# Patient Record
Sex: Female | Born: 2005 | Race: Black or African American | Hispanic: No | Marital: Single | Smoking: Current some day smoker
Health system: Southern US, Community
[De-identification: ages and names within clinical notes are randomized; demographics above are authoritative.]

## PROBLEM LIST (undated history)

## (undated) DIAGNOSIS — N739 Female pelvic inflammatory disease, unspecified: Secondary | ICD-10-CM

## (undated) DIAGNOSIS — L309 Dermatitis, unspecified: Secondary | ICD-10-CM

## (undated) HISTORY — DX: Dermatitis, unspecified: L30.9

## (undated) HISTORY — PX: NO PAST SURGERIES: SHX2092

## (undated) HISTORY — PX: WISDOM TOOTH EXTRACTION: SHX21

---
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
## 2024-01-31 ENCOUNTER — Ambulatory Visit (HOSPITAL_COMMUNITY)
Admission: EM | Admit: 2024-01-31 | Discharge: 2024-01-31 | Disposition: A | Discharge status: Home / Self Care | Attending: Neurology | Admitting: Neurology

## 2024-01-31 ENCOUNTER — Encounter (HOSPITAL_COMMUNITY): Payer: Self-pay

## 2024-01-31 DIAGNOSIS — Z113 Encounter for screening for infections with a predominantly sexual mode of transmission: Secondary | ICD-10-CM | POA: Diagnosis not present

## 2024-01-31 NOTE — ED Provider Notes (Addendum)
 [facility]    CSN: [id number] Arrival date & time: 01/31/24  1324      History   Chief Complaint Chief Complaint  Patient presents with   SEXUALLY TRANSMITTED DISEASE    HPI Amy Higgins is a 18 y.o. female.   Patient presents for STD testing she does not have a known exposure.  She is having thick white discharge.  Additionally she has noticed that her skin on her face has been more dry than usual.  She has been using over-the-counter glycolic acid on her skin.  This is the main change she has made to her skin care routine.  The history is provided by the patient.    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 Authorizing Provider  albuterol  (PROAIR  HFA) 108 (90 Base) MCG/ACT inhaler INHALE 2 PUFFS EVERY 4 TO 6 HOURS AS NEEDED FOR COUGH/WHEEZE, USE WITH SPACER 07/02/16   Rochester Chuck, MD  cetirizine  (ZYRTEC ) 1 MG/ML syrup Take 5 mLs (5 mg total) by mouth daily. Patient not taking: Reported on 06/28/2023 07/02/16   Rochester Chuck, MD  EPINEPHrine  (EPIPEN  JR 2-PAK) 0.15 MG/0.3ML injection Inject 0.3 mLs (0.15 mg total) into the muscle as needed for anaphylaxis. 07/02/16   Rochester Chuck, MD  fluticasone  (FLONASE ) 50 MCG/ACT nasal spray One spray into each nostril daily. Patient not  taking: Reported on 06/28/2023 07/02/16   Rochester Chuck, MD  HYDROcodone -acetaminophen  (NORCO/VICODIN) 5-325 MG tablet Take 1 tablet by mouth every 4 (four) hours as needed for severe pain. Patient not taking: Reported on 06/28/2023 01/06/23   Vedia Geralds, NP  ibuprofen  (ADVIL ) 600 MG tablet Take 1 tablet (600 mg total) by mouth every 6 (six) hours as needed. 01/06/23   Vedia Geralds, NP  montelukast  (SINGULAIR ) 5 MG chewable tablet Take one tablet at bedtime. Patient not taking: Reported on 06/28/2023 07/02/16   Rochester Chuck, MD  ondansetron  (ZOFRAN ) 4 MG tablet Take 1 tablet (4 mg total) by mouth every 8 (eight) hours as needed for nausea or vomiting. Patient not taking: Reported on 06/28/2023 05/25/22   Dalkin, William A, MD  ondansetron  (ZOFRAN -ODT) 4 MG disintegrating tablet Take 1 tablet (4 mg total) by mouth every 8 (eight) hours as needed. Patient not taking: Reported on 06/28/2023 01/04/23   Dalkin, William A, MD  ranitidine  (ZANTAC ) 15 MG/ML syrup Give 3 ml in the mornings Patient not taking: Reported on 06/28/2023 07/02/16   Rochester Chuck, MD    Family History Family History  Problem Relation Age of Onset   Asthma Father    Eczema Father    Allergic rhinitis Father    Allergic rhinitis Sister    Allergic rhinitis Brother    Allergic rhinitis Paternal Grandmother    Asthma Paternal Grandmother    Eczema Paternal Grandmother    Angioedema Neg Hx    Atopy Neg Hx    Immunodeficiency Neg Hx    Urticaria Neg Hx     Social History Social History   Tobacco Use   Smoking status: Some Days    Types: Cigarettes    Passive exposure: Yes   Smokeless tobacco: Never  Vaping Use   Vaping status: Never Used  Substance Use Topics   Alcohol use: Yes    Comment: Social   Drug use: Not Currently     Allergies   Fish allergy   Review of Systems Review of Systems   Physical Exam Triage Vital Signs ED Triage Vitals  Encounter Vitals Group     BP  01/31/24 1434 (!) 132/90     Systolic BP Percentile --      Diastolic BP Percentile --      Pulse Rate 01/31/24 1434 88     Resp 01/31/24 1434 18     Temp 01/31/24 1434 98.3 F (36.8 C)     Temp Source 01/31/24 1434 Oral     SpO2 01/31/24 1434 98 %     Weight --      Height --      Head Circumference --      Peak Flow --      Pain Score 01/31/24 1435 0     Pain Loc --      Pain Education --      Exclude from Growth Chart --    No data found.  Updated Vital Signs BP (!) 132/90 (BP Location: Left Arm)   Pulse 88   Temp 98.3 F (36.8 C) (Oral)   Resp 18   LMP 01/05/2024 (Approximate)   SpO2 98%   Visual Acuity Right Eye Distance:   Left Eye Distance:   Bilateral Distance:    Right Eye Near:   Left Eye Near:    Bilateral Near:     Physical Exam Vitals and nursing note reviewed.  Constitutional:      General: She is not in acute distress.    Appearance: She is well-developed.  HENT:     Head: Normocephalic and atraumatic.     Comments: She does have some dry skin around her mouth.  No rash or irritation noted though. Eyes:     Conjunctiva/sclera: Conjunctivae normal.  Cardiovascular:     Rate and Rhythm: Normal rate and regular rhythm.     Heart sounds: No murmur heard. Pulmonary:     Effort: Pulmonary effort is normal. No respiratory distress.     Breath sounds: Normal breath sounds.  Abdominal:     Palpations: Abdomen is soft.     Tenderness: There is no abdominal tenderness.  Musculoskeletal:        General: No swelling.     Cervical back: Neck supple.  Skin:    General: Skin is warm and dry.     Capillary Refill: Capillary refill takes less than 2 seconds.  Neurological:     Mental Status: She is alert.  Psychiatric:        Mood and Affect: Mood normal.      UC Treatments / Results  Labs (all labs ordered  are listed, but only abnormal results are displayed) Labs Reviewed  CERVICOVAGINAL ANCILLARY ONLY    EKG   Radiology No results  found.  Procedures Procedures (including critical care time)  Medications Ordered in UC Medications - No data to display  Initial Impression / Assessment and Plan / UC Course  I have reviewed the triage vital signs and the nursing notes.  Pertinent labs & imaging results that were available during my care of the patient were reviewed by me and considered in my medical decision making (see chart for details).  Discussed reducing the amount of glycolic acid she is using on her face and underarms as this might be causing irritation.  She is going to try Aquaphor.  Additionally we will follow-up with her with her STD results and any necessary treatment.  Patient is in agreement with plan of care.  Final Clinical Impressions(s) / UC Diagnoses   Final diagnoses:  Screen for STD (sexually transmitted disease)     Discharge Instructions      Be careful using glycolic acid with additional topical products as it can make your skin more sensitive and cause more irritation.  We will follow-up with you for your STD swab via phone with any abnormal results.  You will also see these results on your MyChart.        ED Prescriptions   None    PDMP not reviewed this encounter.   Imogene Mana, NP 01/31/24 1736    Imogene Mana, NP 01/31/24 1736

## 2024-01-31 NOTE — ED Triage Notes (Signed)
 Pt requesting STD testing. States some itching but nothing new. Denies known exposure.   Pt c/o bumps on lips after doing her skin routine 1.5wks ago.

## 2024-01-31 NOTE — Discharge Instructions (Addendum)
 Be careful using glycolic acid with additional topical products as it can make your skin more sensitive and cause more irritation.  We will follow-up with you for your STD swab via phone with any abnormal results.  You will also see these results on your MyChart.

## 2024-02-01 ENCOUNTER — Telehealth (HOSPITAL_COMMUNITY): Payer: Self-pay

## 2024-02-01 LAB — CERVICOVAGINAL ANCILLARY ONLY
Bacterial Vaginitis (gardnerella): NEGATIVE
Candida Glabrata: NEGATIVE
Candida Vaginitis: POSITIVE — AB
Chlamydia: NEGATIVE
Comment: NEGATIVE
Comment: NEGATIVE
Comment: NEGATIVE
Comment: NEGATIVE
Comment: NEGATIVE
Comment: NORMAL
Neisseria Gonorrhea: NEGATIVE
Trichomonas: NEGATIVE

## 2024-02-01 MED ORDER — FLUCONAZOLE 150 MG PO TABS: 150.0000 mg | ORAL_TABLET | Freq: Once | ORAL | 0 refills | Status: AC

## 2024-02-01 NOTE — Telephone Encounter (Signed)
 Per protocol, pt requires tx with Diflucan.  Rx sent to pharmacy on file.

## 2024-04-20 ENCOUNTER — Encounter

## 2024-04-24 ENCOUNTER — Encounter (HOSPITAL_COMMUNITY): Payer: Self-pay

## 2024-04-24 ENCOUNTER — Ambulatory Visit (HOSPITAL_COMMUNITY)
Admission: EM | Admit: 2024-04-24 | Discharge: 2024-04-24 | Disposition: A | Discharge status: Home / Self Care | Attending: Family Medicine | Admitting: Family Medicine

## 2024-04-24 DIAGNOSIS — B3731 Acute candidiasis of vulva and vagina: Secondary | ICD-10-CM | POA: Insufficient documentation

## 2024-04-24 DIAGNOSIS — N760 Acute vaginitis: Secondary | ICD-10-CM | POA: Insufficient documentation

## 2024-04-24 DIAGNOSIS — B370 Candidal stomatitis: Secondary | ICD-10-CM | POA: Diagnosis present

## 2024-04-24 DIAGNOSIS — Z7721 Contact with and (suspected) exposure to potentially hazardous body fluids: Secondary | ICD-10-CM | POA: Diagnosis not present

## 2024-04-24 MED ORDER — FLUCONAZOLE 100 MG PO TABS: 100.0000 mg | ORAL_TABLET | Freq: Every day | ORAL | 0 refills | Status: DC

## 2024-04-24 NOTE — ED Triage Notes (Signed)
 Patient here today to be tested for Stds. Swab only

## 2024-04-24 NOTE — ED Provider Notes (Signed)
 [facility]    CSN: [id number] Arrival date & time: 04/24/24  1324      History   Chief Complaint Chief Complaint  Patient presents with   SEXUALLY TRANSMITTED DISEASE    HPI Amy Higgins is a 18 y.o. female.   18 year old female who has some mild vaginal itch and would like STI testing.  She denies any specific vaginal discharge.  She does have unprotected sexual relations.  She denies vaginal odor, dysuria, fever, lower abdominal pain.  She wants to do an STI swab but she does not want blood work.     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 Authorizing Provider  fluconazole  (DIFLUCAN ) 100 MG tablet Take 1 tablet (100 mg total) by mouth daily. 04/24/24  Yes Ival Domino, FNP  EPINEPHrine  (EPIPEN  JR 2-PAK) 0.15 MG/0.3ML injection Inject 0.3 mLs (0.15 mg total) into the muscle as needed for anaphylaxis. 07/02/16   Iva Marty Saltness, MD  fluticasone  (FLONASE ) 50 MCG/ACT nasal spray One spray into each nostril daily. Patient not taking: Reported on 06/28/2023 07/02/16   Iva Marty Saltness, MD  ibuprofen  (ADVIL ) 600 MG tablet Take 1 tablet (600 mg total) by mouth every 6 (six) hours as needed. 01/06/23   Lang Maxwell, NP  montelukast  (SINGULAIR ) 5 MG chewable tablet Take one tablet at  bedtime. Patient not taking: Reported on 06/28/2023 07/02/16   Iva Marty Saltness, MD    Family History Family History  Problem Relation Age of Onset   Asthma Father    Eczema Father    Allergic rhinitis Father    Allergic rhinitis Sister    Allergic rhinitis Brother    Allergic rhinitis Paternal Grandmother    Asthma Paternal Grandmother    Eczema Paternal Grandmother    Angioedema Neg Hx    Atopy Neg Hx    Immunodeficiency Neg Hx    Urticaria Neg Hx     Social History Social History   Tobacco Use   Smoking status: Some Days    Types: Cigarettes    Passive exposure: Yes   Smokeless tobacco: Never  Vaping Use   Vaping status: Never Used  Substance Use Topics   Alcohol use: Yes    Comment: Social   Drug use: Not Currently     Allergies   Fish allergy   Review of Systems Review of Systems  Constitutional:  Negative for chills and fever.  HENT:  Positive for mouth sores (She has a white coated tongue that sore). Negative for ear  pain and sore throat.   Eyes:  Negative for pain and visual disturbance.  Respiratory:  Negative for cough and shortness of breath.   Cardiovascular:  Negative for chest pain and palpitations.  Gastrointestinal:  Negative for abdominal pain, constipation, diarrhea, nausea and vomiting.  Genitourinary:  Negative for dysuria, hematuria and vaginal discharge (No vaginal discharge but she has a mild vaginal itch).  Musculoskeletal:  Negative for arthralgias and back pain.  Skin:  Negative for color change and rash.  Neurological:  Negative for seizures and syncope.  All other systems reviewed and are negative.    Physical Exam Triage Vital Signs ED Triage Vitals [04/24/24 1351]  Encounter Vitals Group     BP 117/78     Girls Systolic BP Percentile      Girls Diastolic BP Percentile      Boys Systolic BP Percentile      Boys Diastolic BP Percentile      Pulse Rate 82     Resp 16     Temp 98.4 F (36.9 C)     Temp Source Oral      SpO2 96 %     Weight      Height      Head Circumference      Peak Flow      Pain Score 0     Pain Loc      Pain Education      Exclude from Growth Chart    No data found.  Updated Vital Signs BP 117/78 (BP Location: Right Arm)   Pulse 82   Temp 98.4 F (36.9 C) (Oral)   Resp 16   LMP 03/29/2024 (Exact Date)   SpO2 96%   Visual Acuity Right Eye Distance:   Left Eye Distance:   Bilateral Distance:    Right Eye Near:   Left Eye Near:    Bilateral Near:     Physical Exam Vitals and nursing note reviewed.  Constitutional:      General: She is not in acute distress.    Appearance: She is well-developed. She is not ill-appearing or toxic-appearing.  HENT:     Head: Normocephalic and atraumatic.     Right Ear: Hearing, tympanic membrane, ear canal and external ear normal.     Left Ear: Hearing, tympanic membrane, ear canal and external ear normal.     Nose: No congestion or rhinorrhea.     Right Sinus: No maxillary sinus tenderness or frontal sinus tenderness.     Left Sinus: No maxillary sinus tenderness or frontal sinus tenderness.     Mouth/Throat:     Lips: Pink.     Mouth: Mucous membranes are moist.     Tongue: Lesions (White coated tongue) present.     Pharynx: Uvula midline. No oropharyngeal exudate or posterior oropharyngeal erythema.     Tonsils: No tonsillar exudate.  Eyes:     Conjunctiva/sclera: Conjunctivae normal.     Pupils: Pupils are equal, round, and reactive to light.  Cardiovascular:     Rate and Rhythm: Normal rate and regular rhythm.     Heart sounds: S1 normal and S2 normal. No murmur heard. Pulmonary:     Effort: Pulmonary effort is normal. No respiratory distress.     Breath sounds: Normal breath sounds. No decreased breath sounds, wheezing, rhonchi or rales.  Abdominal:     General: Bowel sounds are normal.     Palpations: Abdomen is soft.     Tenderness: There is no abdominal  tenderness.  Genitourinary:    Comments: GU exam declined.   Patient wants to do a self swab only Musculoskeletal:        General: No swelling.     Cervical back: Neck supple.  Lymphadenopathy:     Head:     Right side of head: No submental, submandibular, tonsillar, preauricular or posterior auricular adenopathy.     Left side of head: No submental, submandibular, tonsillar, preauricular or posterior auricular adenopathy.     Cervical: No cervical adenopathy.     Right cervical: No superficial cervical adenopathy.    Left cervical: No superficial cervical adenopathy.  Skin:    General: Skin is warm and dry.     Capillary Refill: Capillary refill takes less than 2 seconds.     Findings: No rash.  Neurological:     Mental Status: She is alert and oriented to person, place, and time.  Psychiatric:        Mood and Affect: Mood normal.      UC Treatments / Results  Labs (all labs ordered are listed, but only abnormal results are displayed) Labs Reviewed  CERVICOVAGINAL ANCILLARY ONLY    EKG   Radiology No results found.  Procedures Procedures (including critical care time)  Medications Ordered in UC Medications - No data to display  Initial Impression / Assessment and Plan / UC Course  I have reviewed the triage vital signs and the nursing notes.  Pertinent labs & imaging results that were available during my care of the patient were reviewed by me and considered in my medical decision making (see chart for details).  Plan of Care: Vaginitis: Patient self collected STI swab.  Patient declined blood work.  Will adjust the plan of care, if needed once the swab results.  Oral thrush and vaginal candidiasis: Fluconazole  100 mg daily for 7 days.  Follow-up as needed.  I reviewed the plan of care with the patient and/or the patient's guardian.  The patient and/or guardian had time to ask questions and acknowledged that the questions were answered.  I provided instruction on symptoms or reasons to return here or to go to an ER, if  symptoms/condition did not improve, worsened or if new symptoms occurred.  Final Clinical Impressions(s) / UC Diagnoses   Final diagnoses:  Exposure to potentially hazardous body fluids  Vaginal candidiasis  Oral thrush  Vaginitis and vulvovaginitis     Discharge Instructions      Oral thrush and vaginal candidiasis: Based on exam we will treat for oral thrush.  Fluconazole  100 mg daily for 7 days.    Vaginitis and exposure to body fluids: STI swab collected.  Declined STI blood test.  Will adjust the plan of care, if needed once the STI swab results.  Follow-up if symptoms do not improve, worsen or new symptoms occur.     ED Prescriptions     Medication Sig Dispense Auth. Provider   fluconazole  (DIFLUCAN ) 100 MG tablet Take 1 tablet (100 mg total) by mouth daily. 7 tablet Reginna Sermeno, FNP      PDMP not reviewed this encounter.   Ival Domino, FNP 04/24/24 [phone]

## 2024-04-24 NOTE — Discharge Instructions (Signed)
 Oral thrush and vaginal candidiasis: Based on exam we will treat for oral thrush.  Fluconazole  100 mg daily for 7 days.    Vaginitis and exposure to body fluids: STI swab collected.  Declined STI blood test.  Will adjust the plan of care, if needed once the STI swab results.  Follow-up if symptoms do not improve, worsen or new symptoms occur.

## 2024-04-25 LAB — CERVICOVAGINAL ANCILLARY ONLY
Bacterial Vaginitis (gardnerella): POSITIVE — AB
Candida Glabrata: NEGATIVE
Candida Vaginitis: NEGATIVE
Chlamydia: NEGATIVE
Comment: NEGATIVE
Comment: NEGATIVE
Comment: NEGATIVE
Comment: NEGATIVE
Comment: NEGATIVE
Comment: NORMAL
Neisseria Gonorrhea: NEGATIVE
Trichomonas: POSITIVE — AB

## 2024-04-27 ENCOUNTER — Ambulatory Visit (HOSPITAL_COMMUNITY): Payer: Self-pay

## 2024-04-27 MED ORDER — METRONIDAZOLE 500 MG PO TABS: 500.0000 mg | ORAL_TABLET | Freq: Two times a day (BID) | ORAL | 0 refills | Status: AC

## 2024-05-21 ENCOUNTER — Ambulatory Visit (HOSPITAL_COMMUNITY): Admission: EM | Admit: 2024-05-21 | Discharge: 2024-05-21 | Discharge status: Against Medical Advice

## 2024-05-21 NOTE — ED Notes (Signed)
No answer from lobby  

## 2024-05-21 NOTE — ED Notes (Signed)
 No answer x2

## 2024-06-03 ENCOUNTER — Inpatient Hospital Stay
Admission: RE | Admit: 2024-06-03 | Discharge: 2024-06-03 | Discharge status: Home / Self Care | Source: Ambulatory Visit | Attending: Family Medicine

## 2024-06-03 ENCOUNTER — Other Ambulatory Visit: Payer: Self-pay

## 2024-06-03 VITALS — BP 119/77 | HR 66 | Temp 98.3°F | Resp 16

## 2024-06-03 DIAGNOSIS — Z113 Encounter for screening for infections with a predominantly sexual mode of transmission: Secondary | ICD-10-CM | POA: Diagnosis present

## 2024-06-03 DIAGNOSIS — N949 Unspecified condition associated with female genital organs and menstrual cycle: Secondary | ICD-10-CM | POA: Insufficient documentation

## 2024-06-03 MED ORDER — ACYCLOVIR 400 MG PO TABS: 400.0000 mg | ORAL_TABLET | Freq: Three times a day (TID) | ORAL | 0 refills | Status: AC

## 2024-06-03 MED ORDER — DOXYCYCLINE HYCLATE 100 MG PO CAPS: 100.0000 mg | ORAL_CAPSULE | Freq: Two times a day (BID) | ORAL | 0 refills | Status: DC

## 2024-06-03 NOTE — ED Provider Notes (Signed)
 [facility]    CSN: [id number] Arrival date & time: 06/03/24  1250      History   Chief Complaint Chief Complaint  Patient presents with   bumps in vaginal area    HPI Amy Higgins is a 18 y.o. female presents for genital sores.  Patient reports a week and a half of painful spreading vaginal bumps on the left and right side of her vagina.  She states she has pain with sitting, palpation or movement.  Only has pain when she urinates if the urine touches lesions.  She denies any vaginal discharge, fevers, hematuria.  No known STD exposure or concern.  Denies history of herpes or vaginal rashes in the past.  She does shave/trim the area frequently.  In addition she was treated in mid July for trichomonas and BV.  She states she was asymptomatic at the time.  She would like to be retested for test of cure.  No other concerns at this time.  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 Authorizing Provider  acyclovir  (ZOVIRAX ) 400 MG tablet Take 1 tablet (400 mg total) by mouth 3 (three) times daily for 7 days. 06/03/24 06/10/24 Yes Franz Svec, Jodi R, NP  doxycycline  (VIBRAMYCIN ) 100 MG capsule Take 1 capsule (100 mg total) by mouth 2 (two) times daily. 06/03/24  Yes Shelagh Rayman, Jodi R, NP  EPINEPHrine  (EPIPEN  JR 2-PAK) 0.15  MG/0.3ML injection Inject 0.3 mLs (0.15 mg total) into the muscle as needed for anaphylaxis. 07/02/16   Iva Marty Saltness, MD  fluconazole  (DIFLUCAN ) 100 MG tablet Take 1 tablet (100 mg total) by mouth daily. 04/24/24   Ival Domino, FNP  fluticasone  (FLONASE ) 50 MCG/ACT nasal spray One spray into each nostril daily. Patient not taking: Reported on 06/28/2023 07/02/16   Iva Marty Saltness, MD  ibuprofen  (ADVIL ) 600 MG tablet Take 1 tablet (600 mg total) by mouth every 6 (six) hours as needed. 01/06/23   Lang Maxwell, NP  montelukast  (SINGULAIR ) 5 MG chewable tablet Take one tablet at bedtime. Patient not taking: Reported on 06/28/2023 07/02/16   Iva Marty Saltness, MD    Family History Family History  Problem Relation Age of Onset   Asthma Father    Eczema Father    Allergic rhinitis Father    Allergic rhinitis Sister    Allergic rhinitis Brother    Allergic rhinitis Paternal Grandmother    Asthma Paternal Grandmother    Eczema Paternal Grandmother    Angioedema Neg Hx  Atopy Neg Hx    Immunodeficiency Neg Hx    Urticaria Neg Hx     Social History Social History   Tobacco Use   Smoking status: Some Days    Types: Cigarettes    Passive exposure: Yes   Smokeless tobacco: Never  Vaping Use   Vaping status: Never Used  Substance Use Topics   Alcohol use: Yes    Comment: Social   Drug use: Not Currently     Allergies   Fish allergy   Review of Systems Review of Systems  Genitourinary:  Positive for vaginal pain.     Physical Exam Triage Vital Signs ED Triage Vitals  Encounter Vitals Group     BP 06/03/24 1311 119/77     Girls Systolic BP Percentile --      Girls Diastolic BP Percentile --      Boys Systolic BP Percentile --      Boys Diastolic BP Percentile --      Pulse Rate 06/03/24 1311 66     Resp 06/03/24 1311 16     Temp 06/03/24 1311 98.3 F (36.8 C)     Temp Source 06/03/24 1311 Oral     SpO2 06/03/24 1311 98 %     Weight --       Height --      Head Circumference --      Peak Flow --      Pain Score 06/03/24 1309 8     Pain Loc --      Pain Education --      Exclude from Growth Chart --    No data found.  Updated Vital Signs BP 119/77   Pulse 66   Temp 98.3 F (36.8 C) (Oral)   Resp 16   LMP 05/24/2024   SpO2 98%   Visual Acuity Right Eye Distance:   Left Eye Distance:   Bilateral Distance:    Right Eye Near:   Left Eye Near:    Bilateral Near:     Physical Exam Vitals and nursing note reviewed. Exam conducted with a chaperone present Art gallery manager).  Constitutional:      General: She is not in acute distress.    Appearance: Normal appearance. She is not ill-appearing.  HENT:     Head: Normocephalic and atraumatic.  Eyes:     Pupils: Pupils are equal, round, and reactive to light.  Cardiovascular:     Rate and Rhythm: Normal rate.  Pulmonary:     Effort: Pulmonary effort is normal.  Genitourinary:     Comments: There are scattered painful papules bilaterally along the labia that extends to the buttock.  There is no drainage swelling induration or fluctuance. Skin:    General: Skin is warm and dry.  Neurological:     General: No focal deficit present.     Mental Status: She is alert and oriented to person, place, and time.  Psychiatric:        Mood and Affect: Mood normal.        Behavior: Behavior normal.      UC Treatments / Results  Labs (all labs ordered are listed, but only abnormal results are displayed) Labs Reviewed  HSV 1/2 PCR (SURFACE)  CERVICOVAGINAL ANCILLARY ONLY    EKG   Radiology No results found.  Procedures Procedures (including critical care time)  Medications Ordered in UC Medications - No data to display  Initial Impression / Assessment and Plan / UC Course  I  have reviewed the triage vital signs and the nursing notes.  Pertinent labs & imaging results that were available during my care of the patient were reviewed by me and considered in my  medical decision making (see chart for details).     I reviewed exam and symptoms with patient.  Swab for HSV obtained and will contact for any positive results. Does not appear classic for this given her symptoms advised to start antiviral medication will await results.  Will also start doxycycline  to cover for any type of folliculitis or skin infection as well.  She was instructed not to shave or trim the area until symptoms have completely resolved.  STD testing as requested and will contact for any positive results.  Advised PCP or gynecology follow-up as symptoms do not improve.  ER precautions reviewed. Final Clinical Impressions(s) / UC Diagnoses   Final diagnoses:  Screening examination for STD (sexually transmitted disease)  Genital lesion, female     Discharge Instructions      Clinical contact you with results of the testing done today if positive.  Start acyclovir  antiviral medication 3 times a day for a week.  Also start doxycycline  twice daily for 10 days.  Avoid shaving or trimming the area until your symptoms have completely resolved.  Please follow-up with your gynecologist or PCP if your symptoms do not improve.  Please go to the ER for any worsening symptoms.  Hope you feel better soon!    ED Prescriptions     Medication Sig Dispense Auth. Provider   acyclovir  (ZOVIRAX ) 400 MG tablet Take 1 tablet (400 mg total) by mouth 3 (three) times daily for 7 days. 21 tablet Addam Goeller, Jodi R, NP   doxycycline  (VIBRAMYCIN ) 100 MG capsule Take 1 capsule (100 mg total) by mouth 2 (two) times daily. 20 capsule Tona Qualley, Jodi R, NP      PDMP not reviewed this encounter.   Loreda Myla SAUNDERS, NP 06/03/24 1327

## 2024-06-03 NOTE — ED Triage Notes (Signed)
 Pt c/o bumps in vagina and states it's painful when she sits on them and moves aroundx1.5wks ago

## 2024-06-03 NOTE — Discharge Instructions (Addendum)
 Clinical contact you with results of the testing done today if positive.  Start acyclovir  antiviral medication 3 times a day for a week.  Also start doxycycline  twice daily for 10 days.  Avoid shaving or trimming the area until your symptoms have completely resolved.  Please follow-up with your gynecologist or PCP if your symptoms do not improve.  Please go to the ER for any worsening symptoms.  Hope you feel better soon!

## 2024-06-04 LAB — CERVICOVAGINAL ANCILLARY ONLY
Chlamydia: POSITIVE — AB
Comment: NEGATIVE
Comment: NEGATIVE
Comment: NORMAL
Neisseria Gonorrhea: NEGATIVE
Trichomonas: NEGATIVE

## 2024-06-04 LAB — HSV 1/2 PCR (SURFACE)
HSV-1 DNA: NOT DETECTED
HSV-2 DNA: DETECTED — AB

## 2024-06-05 ENCOUNTER — Ambulatory Visit (HOSPITAL_COMMUNITY): Payer: Self-pay

## 2024-09-07 ENCOUNTER — Ambulatory Visit (HOSPITAL_COMMUNITY)
Admission: EM | Admit: 2024-09-07 | Discharge: 2024-09-07 | Disposition: A | Discharge status: Home / Self Care | Attending: Emergency Medicine | Admitting: Emergency Medicine

## 2024-09-07 ENCOUNTER — Encounter (HOSPITAL_COMMUNITY): Payer: Self-pay | Admitting: Emergency Medicine

## 2024-09-07 DIAGNOSIS — Z113 Encounter for screening for infections with a predominantly sexual mode of transmission: Secondary | ICD-10-CM | POA: Diagnosis present

## 2024-09-07 DIAGNOSIS — N898 Other specified noninflammatory disorders of vagina: Secondary | ICD-10-CM | POA: Insufficient documentation

## 2024-09-07 LAB — HIV ANTIBODY (ROUTINE TESTING W REFLEX): HIV Screen 4th Generation wRfx: NONREACTIVE

## 2024-09-07 NOTE — ED Provider Notes (Signed)
 [facility]    CSN: [id number] Arrival date & time: 09/07/24  1038      History   Chief Complaint Chief Complaint  Patient presents with   SEXUALLY TRANSMITTED DISEASE   Vaginal Discharge    HPI Amy Higgins is a 18 y.o. female.   Patient presents with malodorous discharge that began about 1 week ago.  Denies abnormal vaginal bleeding, vaginal pain, vaginal lesions, dysuria, hematuria, and urinary urgency/frequency.  LMP 11/12.  Patient reports that she is sexually active.  Patient denies any known exposures to STDs.  The history is provided by the patient and medical records.  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 Authorizing Provider  EPINEPHrine  (EPIPEN  JR 2-PAK) 0.15 MG/0.3ML injection Inject 0.3 mLs (0.15 mg total) into the muscle as needed for anaphylaxis. 07/02/16   Iva Marty Saltness, MD  fluticasone  (FLONASE ) 50 MCG/ACT nasal spray One spray into each nostril daily. Patient not taking: Reported on 06/28/2023 07/02/16   Iva Marty Saltness, MD  ibuprofen  (ADVIL ) 600 MG tablet Take 1 tablet (600 mg total) by mouth every 6 (six) hours as needed. 01/06/23   Lang Maxwell, NP  montelukast  (SINGULAIR ) 5 MG chewable tablet Take one tablet at bedtime. Patient not taking:  Reported on 06/28/2023 07/02/16   Iva Marty Saltness, MD    Family History Family History  Problem Relation Age of Onset   Asthma Father    Eczema Father    Allergic rhinitis Father    Allergic rhinitis Sister    Allergic rhinitis Brother    Allergic rhinitis Paternal Grandmother    Asthma Paternal Grandmother    Eczema Paternal Grandmother    Angioedema Neg Hx    Atopy Neg Hx    Immunodeficiency Neg Hx    Urticaria Neg Hx     Social History Social History   Tobacco Use   Smoking status: Some Days    Types: Cigarettes    Passive exposure: Yes   Smokeless tobacco: Never  Vaping Use   Vaping status: Never Used  Substance Use Topics   Alcohol use: Yes    Comment: Social   Drug use: Not Currently     Allergies   Fish allergy   Review of Systems Review of Systems  Genitourinary:  Positive for vaginal discharge.   Per HPI  Physical Exam Triage Vital Signs ED Triage Vitals  Encounter Vitals Group     BP 09/07/24 1151 119/71     Girls Systolic BP  Percentile --      Girls Diastolic BP Percentile --      Boys Systolic BP Percentile --      Boys Diastolic BP Percentile --      Pulse Rate 09/07/24 1151 83     Resp 09/07/24 1151 16     Temp 09/07/24 1151 98.4 F (36.9 C)     Temp Source 09/07/24 1151 Oral     SpO2 09/07/24 1151 100 %     Weight --      Height --      Head Circumference --      Peak Flow --      Pain Score 09/07/24 1148 0     Pain Loc --      Pain Education --      Exclude from Growth Chart --    No data found.  Updated Vital Signs BP 119/71 (BP Location: Left Arm)   Pulse 83   Temp 98.4 F (36.9 C) (Oral)   Resp 16   LMP 08/22/2024 (Approximate)   SpO2 100%   Visual Acuity Right Eye Distance:   Left Eye Distance:   Bilateral Distance:    Right Eye Near:   Left Eye Near:    Bilateral Near:     Physical Exam Vitals and nursing note reviewed.  Constitutional:      General: She is awake. She is not in acute distress.     Appearance: Normal appearance. She is well-developed and well-groomed. She is not ill-appearing.  Abdominal:     General: Abdomen is flat. Bowel sounds are normal.     Palpations: Abdomen is soft.     Tenderness: There is no abdominal tenderness.  Genitourinary:    Comments: Exam deferred Skin:    General: Skin is warm and dry.  Neurological:     Mental Status: She is alert.  Psychiatric:        Behavior: Behavior is cooperative.      UC Treatments / Results  Labs (all labs ordered are listed, but only abnormal results are displayed) Labs Reviewed  HIV ANTIBODY (ROUTINE TESTING W REFLEX)  SYPHILIS: RPR W/REFLEX TO RPR TITER AND TREPONEMAL ANTIBODIES, TRADITIONAL SCREENING AND DIAGNOSIS ALGORITHM  CERVICOVAGINAL ANCILLARY ONLY    EKG   Radiology No results found.  Procedures Procedures (including critical care time)  Medications Ordered in UC Medications - No data to display  Initial Impression / Assessment and Plan / UC Course  I have reviewed the triage vital signs and the nursing notes.  Pertinent labs & imaging results that were available during my care of the patient were reviewed by me and considered in my medical decision making (see chart for details).     Patient is overall well-appearing.  Vitals are stable.  GU exam deferred.  Patient performed self swab for STD/STI.  HIV and RPR ordered.  Discussed follow-up and return precautions. Final Clinical Impressions(s) / UC Diagnoses   Final diagnoses:  Vaginal discharge  Screen for STD (sexually transmitted disease)     Discharge Instructions      Your results will come back over the next few days and someone will call if results are positive and require treatment.  Return here as needed.    ED Prescriptions   None    PDMP not reviewed this encounter.   Johnie Flaming A, NP 09/07/24 1214

## 2024-09-07 NOTE — ED Triage Notes (Signed)
 Patient wants to be tested for STI, patient has an odor and discharge  that has been going on for a week, patient has not took any medication

## 2024-09-07 NOTE — Discharge Instructions (Signed)
 Your results will come back over the next few days and someone will call if results are positive and require treatment.  Return here as needed.

## 2024-09-08 LAB — SYPHILIS: RPR W/REFLEX TO RPR TITER AND TREPONEMAL ANTIBODIES, TRADITIONAL SCREENING AND DIAGNOSIS ALGORITHM: RPR Ser Ql: NONREACTIVE

## 2024-09-11 ENCOUNTER — Ambulatory Visit (HOSPITAL_COMMUNITY): Payer: Self-pay

## 2024-09-11 LAB — CERVICOVAGINAL ANCILLARY ONLY
Bacterial Vaginitis (gardnerella): POSITIVE — AB
Candida Glabrata: NEGATIVE
Candida Vaginitis: NEGATIVE
Chlamydia: NEGATIVE
Comment: NEGATIVE
Comment: NEGATIVE
Comment: NEGATIVE
Comment: NEGATIVE
Comment: NEGATIVE
Comment: NORMAL
Neisseria Gonorrhea: NEGATIVE
Trichomonas: NEGATIVE

## 2024-09-11 MED ORDER — METRONIDAZOLE 500 MG PO TABS: 500.0000 mg | ORAL_TABLET | Freq: Two times a day (BID) | ORAL | 0 refills | Status: AC

## 2024-10-15 ENCOUNTER — Ambulatory Visit: Payer: Self-pay

## 2024-10-15 NOTE — Telephone Encounter (Signed)
" ° ° °  Summary: Cannot close hands, cannot make a fist   Reason for Triage: 8 months, cannot close both hands and make a fist. Scheduled for new patient appt but concerned about her hands  Best contact: [phone]     "

## 2024-10-15 NOTE — Telephone Encounter (Signed)
 FYI Only or Action Required?: FYI only for provider: UC, previous PCP or ED.  Patient was last seen in primary care on NA.  Called Nurse Triage reporting Pain.  Symptoms began October 2024.  Interventions attempted: Rest, hydration, or home remedies.  Symptoms are: unchanged.  Triage Disposition: See PCP When Office is Open (Within 3 Days)  Patient/caregiver understands and will follow disposition?: Yes Reason for Disposition  [1] MODERATE pain (e.g., interferes with normal activities) AND [2] present > 3 days  Answer Assessment - Initial Assessment Questions Patient reports she is a former smoker, quit in October 2024. Since then, she has been experiencing pain when trying to make a fist with both hands and having bilateral foot pain.  1. ONSET: When did the pain start?     October 2024 2. LOCATION: Where is the pain located?     Hands and feet 3. PAIN: How bad is the pain? (Scale 1-10; or mild, moderate, severe)     9/10 4. WORK OR EXERCISE: Has there been any recent work or exercise that involved this part (i.e., hand or wrist) of the body?     Feet hurt more after walking 5. CAUSE: What do you think is causing the pain?     Unknown 6. AGGRAVATING FACTORS: What makes the pain worse? (e.g., using computer)     Walking or making a fist  Protocols used: Hand Pain-A-AH  Past Medical History:  Diagnosis Date   Eczema    PID (pelvic inflammatory disease)    per patient

## 2024-11-16 ENCOUNTER — Ambulatory Visit: Payer: Self-pay

## 2024-12-20 ENCOUNTER — Ambulatory Visit
